# Patient Record
Sex: Female | Born: 1975 | Hispanic: Yes | Marital: Married | Smoking: Never smoker
Health system: Southern US, Community
[De-identification: ages and names within clinical notes are randomized; demographics above are authoritative.]

## PROBLEM LIST (undated history)

## (undated) DIAGNOSIS — F419 Anxiety disorder, unspecified: Secondary | ICD-10-CM

## (undated) HISTORY — PX: KNEE ARTHROSCOPY: SHX127

---

## 2012-03-01 ENCOUNTER — Other Ambulatory Visit (HOSPITAL_COMMUNITY)
Admission: RE | Admit: 2012-03-01 | Discharge: 2012-03-01 | Disposition: A | Discharge status: Home / Self Care | Payer: Managed Care, Other (non HMO) | Source: Ambulatory Visit | Attending: Family Medicine | Admitting: Family Medicine

## 2012-03-01 DIAGNOSIS — Z01419 Encounter for gynecological examination (general) (routine) without abnormal findings: Secondary | ICD-10-CM | POA: Insufficient documentation

## 2012-03-02 ENCOUNTER — Other Ambulatory Visit: Payer: Self-pay | Admitting: Family Medicine

## 2012-03-02 DIAGNOSIS — R229 Localized swelling, mass and lump, unspecified: Secondary | ICD-10-CM

## 2013-03-03 ENCOUNTER — Other Ambulatory Visit (HOSPITAL_COMMUNITY)
Admission: RE | Admit: 2013-03-03 | Discharge: 2013-03-03 | Disposition: A | Discharge status: Home / Self Care | Payer: Managed Care, Other (non HMO) | Source: Ambulatory Visit | Attending: Family Medicine | Admitting: Family Medicine

## 2013-03-03 ENCOUNTER — Other Ambulatory Visit: Payer: Self-pay | Admitting: Family Medicine

## 2013-03-03 DIAGNOSIS — Z01419 Encounter for gynecological examination (general) (routine) without abnormal findings: Secondary | ICD-10-CM | POA: Insufficient documentation

## 2015-08-22 ENCOUNTER — Other Ambulatory Visit: Payer: Self-pay | Admitting: Family Medicine

## 2015-08-22 ENCOUNTER — Other Ambulatory Visit (HOSPITAL_COMMUNITY)
Admission: RE | Admit: 2015-08-22 | Discharge: 2015-08-22 | Disposition: A | Discharge status: Home / Self Care | Payer: BLUE CROSS/BLUE SHIELD | Source: Ambulatory Visit | Attending: Family Medicine | Admitting: Family Medicine

## 2015-08-22 DIAGNOSIS — Z01419 Encounter for gynecological examination (general) (routine) without abnormal findings: Secondary | ICD-10-CM | POA: Diagnosis present

## 2015-08-26 LAB — CYTOLOGY - PAP

## 2017-10-06 ENCOUNTER — Other Ambulatory Visit: Payer: Self-pay | Admitting: Nurse Practitioner

## 2017-10-06 DIAGNOSIS — Z139 Encounter for screening, unspecified: Secondary | ICD-10-CM

## 2017-11-03 ENCOUNTER — Ambulatory Visit
Admission: RE | Admit: 2017-11-03 | Discharge: 2017-11-03 | Disposition: A | Discharge status: Home / Self Care | Payer: BLUE CROSS/BLUE SHIELD | Source: Ambulatory Visit | Attending: Nurse Practitioner | Admitting: Nurse Practitioner

## 2017-11-03 DIAGNOSIS — Z139 Encounter for screening, unspecified: Secondary | ICD-10-CM

## 2017-11-04 ENCOUNTER — Other Ambulatory Visit: Payer: Self-pay | Admitting: Nurse Practitioner

## 2017-11-04 DIAGNOSIS — R928 Other abnormal and inconclusive findings on diagnostic imaging of breast: Secondary | ICD-10-CM

## 2017-11-10 ENCOUNTER — Ambulatory Visit: Payer: BLUE CROSS/BLUE SHIELD

## 2017-11-10 ENCOUNTER — Ambulatory Visit
Admission: RE | Admit: 2017-11-10 | Discharge: 2017-11-10 | Disposition: A | Discharge status: Home / Self Care | Payer: BLUE CROSS/BLUE SHIELD | Source: Ambulatory Visit | Attending: Nurse Practitioner | Admitting: Nurse Practitioner

## 2017-11-10 ENCOUNTER — Other Ambulatory Visit: Payer: Self-pay | Admitting: Nurse Practitioner

## 2017-11-10 DIAGNOSIS — R928 Other abnormal and inconclusive findings on diagnostic imaging of breast: Secondary | ICD-10-CM

## 2017-11-10 DIAGNOSIS — N6320 Unspecified lump in the left breast, unspecified quadrant: Secondary | ICD-10-CM

## 2017-11-18 DIAGNOSIS — Z9889 Other specified postprocedural states: Secondary | ICD-10-CM | POA: Insufficient documentation

## 2018-05-16 ENCOUNTER — Other Ambulatory Visit: Payer: Self-pay | Admitting: Nurse Practitioner

## 2018-05-16 ENCOUNTER — Ambulatory Visit
Admission: RE | Admit: 2018-05-16 | Discharge: 2018-05-16 | Disposition: A | Discharge status: Home / Self Care | Payer: BLUE CROSS/BLUE SHIELD | Source: Ambulatory Visit | Attending: Nurse Practitioner | Admitting: Nurse Practitioner

## 2018-05-16 DIAGNOSIS — N6320 Unspecified lump in the left breast, unspecified quadrant: Secondary | ICD-10-CM

## 2018-11-17 ENCOUNTER — Ambulatory Visit
Admission: RE | Admit: 2018-11-17 | Discharge: 2018-11-17 | Disposition: A | Discharge status: Home / Self Care | Payer: BLUE CROSS/BLUE SHIELD | Source: Ambulatory Visit | Attending: Nurse Practitioner | Admitting: Nurse Practitioner

## 2018-11-17 DIAGNOSIS — N6320 Unspecified lump in the left breast, unspecified quadrant: Secondary | ICD-10-CM

## 2019-07-04 ENCOUNTER — Other Ambulatory Visit: Payer: Self-pay | Admitting: Nurse Practitioner

## 2019-07-04 DIAGNOSIS — N6320 Unspecified lump in the left breast, unspecified quadrant: Secondary | ICD-10-CM

## 2019-11-20 ENCOUNTER — Ambulatory Visit
Admission: RE | Admit: 2019-11-20 | Discharge: 2019-11-20 | Disposition: A | Discharge status: Home / Self Care | Payer: BLUE CROSS/BLUE SHIELD | Source: Ambulatory Visit | Attending: Nurse Practitioner | Admitting: Nurse Practitioner

## 2019-11-20 ENCOUNTER — Other Ambulatory Visit: Payer: Self-pay

## 2019-11-20 DIAGNOSIS — N6320 Unspecified lump in the left breast, unspecified quadrant: Secondary | ICD-10-CM

## 2020-02-10 ENCOUNTER — Ambulatory Visit: Payer: Self-pay | Attending: Internal Medicine

## 2020-02-10 DIAGNOSIS — Z23 Encounter for immunization: Secondary | ICD-10-CM | POA: Insufficient documentation

## 2020-02-10 NOTE — Progress Notes (Signed)
   Covid-19 Vaccination Clinic  Name:  Valerie Steele    MRN: [id number] DOB: 04/10/1976  02/10/2020  [name] was observed post Covid-19 immunization for 15 minutes without incident. She was provided with Vaccine Information Sheet and instruction to access the V-Safe system.   [name] was instructed to call 911 with any severe reactions post vaccine: Marland Kitchen Difficulty breathing  . Swelling of face and throat  . A fast heartbeat  . A bad rash all over body  . Dizziness and weakness   Immunizations Administered    Name Date Dose VIS Date Route   Pfizer COVID-19 Vaccine 02/10/2020 12:43 PM 0.3 mL 11/17/2019 Intramuscular   Manufacturer: [place]   Lot: [id number]   [place]: [id number]

## 2020-03-02 ENCOUNTER — Ambulatory Visit: Payer: Self-pay | Attending: Internal Medicine

## 2020-03-02 DIAGNOSIS — Z23 Encounter for immunization: Secondary | ICD-10-CM

## 2020-03-02 NOTE — Progress Notes (Signed)
   Covid-19 Vaccination Clinic  Name:  Valerie Steele    MRN: [id number] DOB: 17-Mar-1976  03/02/2020  [name] was observed post Covid-19 immunization for 15 minutes without incident. She was provided with Vaccine Information Sheet and instruction to access the V-Safe system.   [name] was instructed to call 911 with any severe reactions post vaccine: Marland Kitchen Difficulty breathing  . Swelling of face and throat  . A fast heartbeat  . A bad rash all over body  . Dizziness and weakness   Immunizations Administered    Name Date Dose VIS Date Route   Pfizer COVID-19 Vaccine 03/02/2020 12:10 PM 0.3 mL 11/17/2019 Intramuscular   Manufacturer: [place]   Lot: [id number]   [place]: [id number]

## 2020-08-29 ENCOUNTER — Encounter (HOSPITAL_BASED_OUTPATIENT_CLINIC_OR_DEPARTMENT_OTHER): Payer: Self-pay | Admitting: Obstetrics and Gynecology

## 2020-08-29 ENCOUNTER — Other Ambulatory Visit: Payer: Self-pay

## 2020-08-31 ENCOUNTER — Inpatient Hospital Stay (HOSPITAL_COMMUNITY): Admission: RE | Admit: 2020-08-31 | Payer: 59 | Source: Ambulatory Visit

## 2020-09-02 ENCOUNTER — Other Ambulatory Visit (HOSPITAL_COMMUNITY)
Admission: RE | Admit: 2020-09-02 | Discharge: 2020-09-02 | Disposition: A | Discharge status: Home / Self Care | Payer: 59 | Source: Ambulatory Visit | Attending: Obstetrics and Gynecology | Admitting: Obstetrics and Gynecology

## 2020-09-02 DIAGNOSIS — Z01812 Encounter for preprocedural laboratory examination: Secondary | ICD-10-CM | POA: Diagnosis present

## 2020-09-02 DIAGNOSIS — Z20822 Contact with and (suspected) exposure to covid-19: Secondary | ICD-10-CM | POA: Insufficient documentation

## 2020-09-02 LAB — SARS CORONAVIRUS 2 (TAT 6-24 HRS): SARS Coronavirus 2: NEGATIVE

## 2020-09-03 ENCOUNTER — Other Ambulatory Visit: Payer: Self-pay | Admitting: Obstetrics and Gynecology

## 2020-09-03 NOTE — H&P (Deleted)
  The note originally documented on this encounter has been moved the the encounter in which it belongs.  

## 2020-09-03 NOTE — H&P (Signed)
--------------------------------------------------------------------------------  Subjective:    Chief Complaint(s):      Pre op/ Heavy Menses       HPI:          General          44 yo presents for pre-op visit.            Pt is scheduled for hysteroscopy/D&C w/ hydrothermal ablation on Sept 29, 2021 @ 12 PM for management of menorrhagia.            Pt seen Sept 3, 2020 c/o menorrhagia with her regular cycle. She was not interested in maintaining fertility. She contacted office on Apr 29, 2020 expressing interest in NovaSure ablation or HTA for management.            U/S performed on May 13, 2020 revealed uterus measuring 8.9 x 3.9 x 5.5 cm. Endometrium measured 4.8 mm. 4 fibroids were measured w/ the largest measuring 2.2 cm. RT OV WNL. LT OV contained 2 simple cysts, measuring 2.3 cm and 1.7 cm.            Pelvic exam normal.     Current Medication:      Taking   Valtrex(valACYclovir HCl) 500 MG Tablet 1 tablet Orally every 24 hrs, prn, Notes: prn.      Wellbutrin XL(buPROPion HCl ER (XL)) 300 MG Tablet Extended Release 24 Hour 1 tablet in the morning Orally Once a day.      Allergy(diphenhydrAMINE HCl) 4 MG Tablet 1 tablet as needed Orally every 6 hrs.         Discontinued   MVI.      Mirena 20 MCG/24HR Intrauterine Device as directed Intrauterine.      Vitamin B Complex Capsule as directed Orally.      Vitamin D 2000 UNIT Tablet 1 tablet with a meal Orally Once a day.      buPROPion HCl ER (XL) 150MG  Tablet Extended Release 24 Hour take 1 tablet in the morning once daily Orally Once a day.      Medication List reviewed and reconciled with the patient.      Medical History:   migraines      GDM      postpartum depression       Allergies/Intolerance:      Percocet: Allergy - vomiting    Gyn History:   Sexual activity currently sexually active.   Periods : irregular, every 2 weeks.   LMP 08/24/2020.   Denies Birth control.   Last pap smear date 2018, per pt, at  [facility].   Last mammogram date 05/2019 - per pt .   Abnormal pap smear abnormal in 2004, assessed with colposcopy; treated with conization.   STD Herpes.        OB History:   Number of pregnancies  3.   Pregnancy # 1  06/2006, C-section delivery, boy, 7.4 at 38 wks.   Pregnancy # 2  12/2008, abortion.   Pregnancy # 3  11/2009, live birth, C-section,6.13 at 24 wks, girl.        Surgical History:   c-section x 2 2007, 2010      bilateral foot surgery including bunion surgery 05/1996      left foot 01/2015      left knee surgery 2018       Hospitalization:   c-section x 2 2007 & 2010       Family History:   Father: deceased 16 yrs, Hypertension, CAD, Diabetes, diagnosed with  Diabetes, Hypertension, CVA, Coronary artery disease    Mother: alive, HTN, -insulin diabetes, breast cancer, depression, diagnosed with Breast cancer, Diabetes, Hypertension    Paternal [place] Father: deceased, heart attack, diabetes    Paternal [place] Mother: deceased, diabetes    Maternal Grand Father: deceased, killed in war    Maternal [place] Mother: alive, insulin dibetes, HTN    Sister 1: alive, insulin disbetes    1 sister(s) . 1 son(s) , 1 daughter(s) .          1/2 sister-heart disease.     Social History:       General         Tobacco use cigarettes:  Never smoked, Tobacco history last updated  08/27/2020, Vaping  No.           no EXPOSURE TO PASSIVE SMOKE.           Alcohol: yes, occasionally.           Caffeine: yes, coffee, 1 serving daily.           no Recreational drug use.           DIET: regular.           Exercise: regularly.           DENTAL CARE: every 6 mos.           Marital Status: married, husbands name is Hydrographic surveyor..           Children: 1, daughter (s), 1, son (s).           OCCUPATION: House wife.      ROS:       CONSTITUTIONAL         Chills  No.  Fatigue  No.  Fever  No.  Night sweats  No.  Recent travel outside [place]  No.  Sweats  No.  Weight change  No.         OPHTHALMOLOGY          Blurring of vision  no.  Change in vision  no.  Double vision  no.         ENT         Dizziness  no.  Nose bleeds  no.  Sore throat  no.  Teeth pain  no.         ALLERGY         Hives  no.         CARDIOLOGY         Chest pain  no.  High blood pressure  no.  Irregular heart beat  no.  Leg edema  no.  Palpitations  no.         RESPIRATORY         Shortness of breath  no.  Cough  no.  Wheezing  no.         UROLOGY         Pain with urination  no.  Urinary urgency  no.  Urinary frequency  no.  Urinary incontinence  no.  Difficulty urinating  No.  Blood in urine  No.         GASTROENTEROLOGY         Abdominal pain  no.  Appetite change  no.  Bloating/belching  no.  Blood in stool or on toilet paper  no.  Change in bowel movements  no.  Constipation  no.  Diarrhea  no.  Difficulty swallowing  no.  Nausea  no.  FEMALE REPRODUCTIVE         Vulvar pain  no.  Vulvar rash  no.  Abnormal vaginal bleeding  no.  Breast pain  no.  Nipple discharge  no.  Pain with intercourse  no.  Pelvic pain  no.  Unusual vaginal discharge  no.  Vaginal itching  no.         MUSCULOSKELETAL         Muscle aches  no.         NEUROLOGY         Headache  YES.  Tingling/numbness  no.  Weakness  no.         PSYCHOLOGY         Depression  no.  Anxiety  YES.  Nervousness  no.  Sleep disturbances  no.  Suicidal ideation  no .         ENDOCRINOLOGY         Excessive thirst  no.  Excessive urination  no.  Hair loss  no.  Heat or cold intolerance  no.         HEMATOLOGY/LYMPH         Abnormal bleeding  no.  Easy bruising  no.  Swollen glands  no.         DERMATOLOGY         New/changing skin lesion  no.  Rash  no.  Sores  no.            Negative except as stated in HPI.   Objective:    Vitals:        Wt 112.4, Wt change 4.4 lb, Ht 59.25, BMI 22.51, Pulse sitting 66, BP sitting 106/58.     Past Results:    Examination:          General Examination         CONSTITUTIONAL: alert, oriented, NAD .           SKIN: moist, warm.          EYES: Conjunctiva clear.          LUNGS: good I:E efffort noted, CTA bilat.          HEART: RRR.          ABDOMEN: soft, non-tender/non-distended, bowel sounds present .          FEMALE GENITOURINARY: normal external genitalia, labia - unremarkable, vagina - pink moist mucosa, no lesions or abnormal discharge, cervix - no discharge or lesions or CMT, adnexa - no masses or tenderness, uterus - nontender and normal size on palpation .          PSYCH: affect normal, good eye contact.      Physical Examination:    Assessment:     Assessment:    Menorrhagia with regular cycle - N92.0 (Primary)      Uterine fibroid - D25.9        Plan:    Treatment:      Menorrhagia with regular cycle          Notes: Pt is scheduled for hysteroscopy/D&C w/ hydrothermal ablation on Sept 29, 2021 @ 12 PM for management of menorrhagia and fibroids. Pt advised that 93% of women have decreased or no period for up to 5 yrs. She is advised that this will manage menorrhagia, but not fibroids. Discussed w/ pt risks of hysteroscopy including but not limited to infection, bleeding, possible perforation of uterus, with the need for further surgery. Pt advised to avoid NSAIDs (  Asprin, Aleve, Advil, Ibuprofen, Motrin) from now until surgery given risk of bleeding during surgery. She is advised to avoid eating or drinking starting midnight prior to surgery. Pt is advised that she may have watery discharge for 2 wks after surgery. Discussed post-surgery avoidance of driving for 24 hrs and avoidance of intercourse for 2 weeks after procedure. Follow up 2 wks after surgery for post-op visit.      Uterine fibroid          Notes: Pt is scheduled for hysteroscopy/D&C w/ hydrothermal ablation on Sept 29, 2021 @ 12 PM for management of menorrhagia. Pt advised HTA will not affect her fibroids. Follow up for 2 wk post-op visit.

## 2020-09-04 ENCOUNTER — Ambulatory Visit (HOSPITAL_BASED_OUTPATIENT_CLINIC_OR_DEPARTMENT_OTHER): Payer: 59 | Admitting: Certified Registered"

## 2020-09-04 ENCOUNTER — Ambulatory Visit (HOSPITAL_BASED_OUTPATIENT_CLINIC_OR_DEPARTMENT_OTHER)
Admission: RE | Admit: 2020-09-04 | Discharge: 2020-09-04 | Disposition: A | Discharge status: Home / Self Care | Payer: 59 | Attending: Obstetrics and Gynecology | Admitting: Obstetrics and Gynecology

## 2020-09-04 ENCOUNTER — Encounter (HOSPITAL_BASED_OUTPATIENT_CLINIC_OR_DEPARTMENT_OTHER): Payer: Self-pay | Admitting: Obstetrics and Gynecology

## 2020-09-04 ENCOUNTER — Other Ambulatory Visit: Payer: Self-pay

## 2020-09-04 ENCOUNTER — Encounter (HOSPITAL_BASED_OUTPATIENT_CLINIC_OR_DEPARTMENT_OTHER)
Admission: RE | Disposition: A | Discharge status: Home / Self Care | Payer: Self-pay | Source: Home / Self Care | Attending: Obstetrics and Gynecology

## 2020-09-04 DIAGNOSIS — Z79899 Other long term (current) drug therapy: Secondary | ICD-10-CM | POA: Insufficient documentation

## 2020-09-04 DIAGNOSIS — A609 Anogenital herpesviral infection, unspecified: Secondary | ICD-10-CM | POA: Insufficient documentation

## 2020-09-04 DIAGNOSIS — N840 Polyp of corpus uteri: Secondary | ICD-10-CM | POA: Diagnosis not present

## 2020-09-04 DIAGNOSIS — N841 Polyp of cervix uteri: Secondary | ICD-10-CM | POA: Diagnosis not present

## 2020-09-04 DIAGNOSIS — N920 Excessive and frequent menstruation with regular cycle: Secondary | ICD-10-CM | POA: Insufficient documentation

## 2020-09-04 DIAGNOSIS — Z7722 Contact with and (suspected) exposure to environmental tobacco smoke (acute) (chronic): Secondary | ICD-10-CM | POA: Insufficient documentation

## 2020-09-04 DIAGNOSIS — D259 Leiomyoma of uterus, unspecified: Secondary | ICD-10-CM | POA: Diagnosis present

## 2020-09-04 HISTORY — PX: DILITATION & CURRETTAGE/HYSTROSCOPY WITH HYDROTHERMAL ABLATION: SHX5570

## 2020-09-04 HISTORY — DX: Anxiety disorder, unspecified: F41.9

## 2020-09-04 LAB — POCT PREGNANCY, URINE: Preg Test, Ur: NEGATIVE

## 2020-09-04 SURGERY — DILATATION & CURETTAGE/HYSTEROSCOPY WITH HYDROTHERMAL ABLATION
Anesthesia: General | Site: Vagina

## 2020-09-04 MED ORDER — FENTANYL CITRATE (PF) 100 MCG/2ML IJ SOLN
INTRAMUSCULAR | Status: AC
  Filled 2020-09-04: qty 2

## 2020-09-04 MED ORDER — TRAMADOL HCL 50 MG PO TABS
ORAL_TABLET | ORAL | Status: AC
  Filled 2020-09-04: qty 1

## 2020-09-04 MED ORDER — FENTANYL CITRATE (PF) 100 MCG/2ML IJ SOLN
INTRAMUSCULAR | Status: DC | PRN
Start: 2020-09-04 — End: 2020-09-04
  Administered 2020-09-04: 25 ug via INTRAVENOUS
  Administered 2020-09-04: 50 ug via INTRAVENOUS
  Administered 2020-09-04: 25 ug via INTRAVENOUS

## 2020-09-04 MED ORDER — ONDANSETRON HCL 4 MG/2ML IJ SOLN
INTRAMUSCULAR | Status: AC
  Filled 2020-09-04: qty 2

## 2020-09-04 MED ORDER — PROPOFOL 500 MG/50ML IV EMUL
INTRAVENOUS | Status: DC | PRN
  Administered 2020-09-04: 25 ug/kg/min via INTRAVENOUS

## 2020-09-04 MED ORDER — OXYCODONE HCL 5 MG/5ML PO SOLN: 5.0000 mg | Freq: Once | ORAL | Status: DC | PRN

## 2020-09-04 MED ORDER — MIDAZOLAM HCL 2 MG/2ML IJ SOLN
INTRAMUSCULAR | Status: AC
  Filled 2020-09-04: qty 2

## 2020-09-04 MED ORDER — KETOROLAC TROMETHAMINE 30 MG/ML IJ SOLN
INTRAMUSCULAR | Status: AC
  Filled 2020-09-04: qty 1

## 2020-09-04 MED ORDER — BUPIVACAINE HCL (PF) 0.25 % IJ SOLN
INTRAMUSCULAR | Status: DC | PRN
  Administered 2020-09-04: 20 mL

## 2020-09-04 MED ORDER — MIDAZOLAM HCL 5 MG/5ML IJ SOLN
INTRAMUSCULAR | Status: DC | PRN
  Administered 2020-09-04: 2 mg via INTRAVENOUS

## 2020-09-04 MED ORDER — ACETAMINOPHEN 160 MG/5ML PO SOLN: 325.0000 mg | ORAL | Status: DC | PRN

## 2020-09-04 MED ORDER — SODIUM CHLORIDE 0.9 % IR SOLN
Status: DC | PRN
  Administered 2020-09-04: 3000 mL

## 2020-09-04 MED ORDER — LACTATED RINGERS IV SOLN: INTRAVENOUS | Status: DC

## 2020-09-04 MED ORDER — DEXAMETHASONE SODIUM PHOSPHATE 10 MG/ML IJ SOLN
INTRAMUSCULAR | Status: DC | PRN
  Administered 2020-09-04: 5 mg via INTRAVENOUS

## 2020-09-04 MED ORDER — ACETAMINOPHEN 500 MG PO TABS
ORAL_TABLET | ORAL | Status: AC
  Filled 2020-09-04: qty 2

## 2020-09-04 MED ORDER — KETOROLAC TROMETHAMINE 30 MG/ML IJ SOLN
30.0000 mg | Freq: Once | INTRAMUSCULAR | Status: AC
  Administered 2020-09-04: 30 mg via INTRAVENOUS

## 2020-09-04 MED ORDER — ONDANSETRON HCL 4 MG/2ML IJ SOLN
4.0000 mg | Freq: Once | INTRAMUSCULAR | Status: AC | PRN
  Administered 2020-09-04: 4 mg via INTRAVENOUS

## 2020-09-04 MED ORDER — SILVER NITRATE-POT NITRATE 75-25 % EX MISC
CUTANEOUS | Status: AC
  Filled 2020-09-04: qty 10

## 2020-09-04 MED ORDER — SILVER NITRATE-POT NITRATE 75-25 % EX MISC
CUTANEOUS | Status: DC | PRN
  Administered 2020-09-04: 2

## 2020-09-04 MED ORDER — LIDOCAINE HCL (CARDIAC) PF 100 MG/5ML IV SOSY
PREFILLED_SYRINGE | INTRAVENOUS | Status: DC | PRN
  Administered 2020-09-04: 50 mg via INTRATRACHEAL

## 2020-09-04 MED ORDER — OXYCODONE HCL 5 MG PO TABS: 5.0000 mg | ORAL_TABLET | Freq: Once | ORAL | Status: DC | PRN

## 2020-09-04 MED ORDER — FENTANYL CITRATE (PF) 100 MCG/2ML IJ SOLN
25.0000 ug | INTRAMUSCULAR | Status: DC | PRN
  Administered 2020-09-04: 50 ug via INTRAVENOUS
  Administered 2020-09-04: 25 ug via INTRAVENOUS
  Administered 2020-09-04: 50 ug via INTRAVENOUS
  Administered 2020-09-04: 25 ug via INTRAVENOUS

## 2020-09-04 MED ORDER — MEPERIDINE HCL 25 MG/ML IJ SOLN: 6.2500 mg | INTRAMUSCULAR | Status: DC | PRN

## 2020-09-04 MED ORDER — IBUPROFEN 800 MG PO TABS: 800.0000 mg | ORAL_TABLET | Freq: Three times a day (TID) | ORAL | 0 refills | Status: AC | PRN

## 2020-09-04 MED ORDER — ACETAMINOPHEN 325 MG PO TABS: 325.0000 mg | ORAL_TABLET | ORAL | Status: DC | PRN

## 2020-09-04 MED ORDER — OXYCODONE HCL 5 MG PO TABS
ORAL_TABLET | ORAL | Status: AC
  Filled 2020-09-04: qty 1

## 2020-09-04 MED ORDER — TRAMADOL HCL 50 MG PO TABS
50.0000 mg | ORAL_TABLET | Freq: Four times a day (QID) | ORAL | Status: AC | PRN
  Administered 2020-09-04: 50 mg via ORAL

## 2020-09-04 MED ORDER — POVIDONE-IODINE 10 % EX SWAB: 2.0000 "application " | Freq: Once | CUTANEOUS | Status: DC

## 2020-09-04 MED ORDER — ONDANSETRON HCL 4 MG/2ML IJ SOLN
INTRAMUSCULAR | Status: DC | PRN
  Administered 2020-09-04: 4 mg via INTRAVENOUS

## 2020-09-04 MED ORDER — BUPIVACAINE HCL (PF) 0.25 % IJ SOLN
INTRAMUSCULAR | Status: AC
  Filled 2020-09-04: qty 30

## 2020-09-04 MED ORDER — ACETAMINOPHEN 500 MG PO TABS
1000.0000 mg | ORAL_TABLET | ORAL | Status: AC
  Administered 2020-09-04: 1000 mg via ORAL

## 2020-09-04 MED ORDER — PROPOFOL 10 MG/ML IV BOLUS
INTRAVENOUS | Status: DC | PRN
  Administered 2020-09-04: 120 mg via INTRAVENOUS

## 2020-09-04 SURGICAL SUPPLY — 15 items
CATH ROBINSON RED A/P 16FR (CATHETERS) ×6 IMPLANT
ELECT REM PT RETURN 9FT ADLT (ELECTROSURGICAL)
ELECTRODE REM PT RTRN 9FT ADLT (ELECTROSURGICAL) IMPLANT
GLOVE BIOGEL M 6.5 STRL (GLOVE) ×6 IMPLANT
GLOVE BIOGEL PI IND STRL 6.5 (GLOVE) ×1 IMPLANT
GLOVE BIOGEL PI IND STRL 7.0 (GLOVE) ×1 IMPLANT
GLOVE BIOGEL PI INDICATOR 6.5 (GLOVE) ×2
GLOVE BIOGEL PI INDICATOR 7.0 (GLOVE) ×2
GOWN STRL REUS W/ TWL LRG LVL3 (GOWN DISPOSABLE) ×2 IMPLANT
GOWN STRL REUS W/TWL LRG LVL3 (GOWN DISPOSABLE) ×6
PACK VAGINAL MINOR WOMEN LF (CUSTOM PROCEDURE TRAY) ×3 IMPLANT
PAD OB MATERNITY 4.3X12.25 (PERSONAL CARE ITEMS) ×3 IMPLANT
SET GENESYS HTA PROCERVA (MISCELLANEOUS) ×3 IMPLANT
TOWEL GREEN STERILE FF (TOWEL DISPOSABLE) ×6 IMPLANT
UNDERPAD 30X36 HEAVY ABSORB (UNDERPADS AND DIAPERS) ×3 IMPLANT

## 2020-09-04 NOTE — Discharge Instructions (Signed)
  Post Anesthesia Home Care Instructions  Activity: Get plenty of rest for the remainder of the day. A responsible individual must stay with you for 24 hours following the procedure.  For the next 24 hours, DO NOT: -Drive a car -Paediatric nurse -Drink alcoholic beverages -Take any medication unless instructed by your physician -Make any legal decisions or sign important papers.  Meals: Start with liquid foods such as gelatin or soup. Progress to regular foods as tolerated. Avoid greasy, spicy, heavy foods. If nausea and/or vomiting occur, drink only clear liquids until the nausea and/or vomiting subsides. Call your physician if vomiting continues.  Special Instructions/Symptoms: Your throat may feel dry or sore from the anesthesia or the breathing tube placed in your throat during surgery. If this causes discomfort, gargle with warm salt water. The discomfort should disappear within 24 hours.  May take Tylenol after 5pm, if needed.  May take Ibuprofen after 8pm, if needed.

## 2020-09-04 NOTE — Op Note (Signed)
09/04/2020  1:39 PM  PATIENT:  Valerie Steele  44 y.o. female  PRE-OPERATIVE DIAGNOSIS:  D25.9-Uterine fibroid N92.0-Menorrhagia with regular cycle  POST-OPERATIVE DIAGNOSIS:  D25.9-Uterine fibroid N92.0-Menorrhagia with regular cycle  PROCEDURE:  Procedure(s): DILATATION & CURETTAGE/HYSTEROSCOPY WITH HYDROTHERMAL ABLATION (N/A)  SURGEON:  Surgeon(s) and Role:    Christophe Louis, MD - Primary  PHYSICIAN ASSISTANT:   ASSISTANTS: none   ANESTHESIA:   General   EBL:  10 mL   BLOOD ADMINISTERED:none  DRAINS: none   LOCAL MEDICATIONS USED:  MARCAINE     SPECIMEN:  Source of Specimen:  endometrial currettings and endocervcal polyp   DISPOSITION OF SPECIMEN:  PATHOLOGY  COUNTS:  YES  TOURNIQUET:  * No tourniquets in log *  DICTATION: .Dragon Dictation  PLAN OF CARE: Discharge to home after PACU  PATIENT DISPOSITION:  PACU - hemodynamically stable.   Delay start of Pharmacological VTE agent (>24hrs) due to surgical blood loss or risk of bleeding: not applicable  Findings: normal external genitalia.Marland Kitchen endocervical polyp .. proliferative endometrium   Procedure. The patient was taken to the operative room were a time out was performed. She was placed in the dorsal lithotomy position and prepped and draped in the normal sterile fashion. A speculum was placed in the vaginal vault. The anterior lip of the cervix was grasped with a single tooth tenaculum. The uterus was sounded to 8 cm. The cervix was dilated to 8 mm. The hydrothermal hysteroscope was inserted. The ostia were visualized bilaterally. There was no evidence of perforation. The hydrothermal ablation was performed for 10 minutes.  The hysteroscope was removed. An endometrial curette was used to perform curettage. 10 cc of 25% marcaine was injected at the 4 and 8 oclock position. A The single tooth tenaculum was removed.  Excellent hemostasis was noted.   Sponge lap and needle counts were correct x 2.  The patient was  awakened from anesthesia and taken to the recovery room in stable condition.

## 2020-09-04 NOTE — Anesthesia Preprocedure Evaluation (Addendum)
Anesthesia Evaluation  Patient identified by MRN, date of birth, ID band Patient awake    Reviewed: Allergy & Precautions, H&P , NPO status , Patient's Chart, lab work & pertinent test results, reviewed documented beta blocker date and time   Airway Mallampati: I  TM Distance: >3 FB Neck ROM: full    Dental no notable dental hx. (+) Teeth Intact, Dental Advisory Given   Pulmonary neg pulmonary ROS,    Pulmonary exam normal breath sounds clear to auscultation       Cardiovascular Exercise Tolerance: Good negative cardio ROS   Rhythm:regular Rate:Normal     Neuro/Psych PSYCHIATRIC DISORDERS Anxiety negative neurological ROS     GI/Hepatic negative GI ROS, Neg liver ROS,   Endo/Other  negative endocrine ROS  Renal/GU negative Renal ROS  negative genitourinary   Musculoskeletal   Abdominal   Peds  Hematology negative hematology ROS (+)   Anesthesia Other Findings   Reproductive/Obstetrics negative OB ROS                            Anesthesia Physical Anesthesia Plan  ASA: II  Anesthesia Plan: General   Post-op Pain Management:    Induction: Intravenous  PONV Risk Score and Plan:   Airway Management Planned: LMA  Additional Equipment:   Intra-op Plan:   Post-operative Plan:   Informed Consent: I have reviewed the patients History and Physical, chart, labs and discussed the procedure including the risks, benefits and alternatives for the proposed anesthesia with the patient or authorized representative who has indicated his/her understanding and acceptance.     Dental Advisory Given  Plan Discussed with: CRNA and Anesthesiologist  Anesthesia Plan Comments: ( )        Anesthesia Quick Evaluation

## 2020-09-04 NOTE — Anesthesia Procedure Notes (Signed)
Procedure Name: LMA Insertion Date/Time: 09/04/2020 12:31 PM Performed by: Glory Buff, CRNA Pre-anesthesia Checklist: Patient identified, Emergency Drugs available, Suction available and Patient being monitored Patient Re-evaluated:Patient Re-evaluated prior to induction Oxygen Delivery Method: Circle system utilized Preoxygenation: Pre-oxygenation with 100% oxygen Induction Type: IV induction LMA: LMA inserted LMA Size: 4.0 Placement Confirmation: positive ETCO2 Tube secured with: Tape Dental Injury: Teeth and Oropharynx as per pre-operative assessment

## 2020-09-04 NOTE — H&P (Signed)
Date of Initial H&P: 09/03/2020  History reviewed, patient examined, no change in status, stable for surgery.

## 2020-09-04 NOTE — Transfer of Care (Signed)
Immediate Anesthesia Transfer of Care Note  Patient: Valerie Steele  Procedure(s) Performed: DILATATION & CURETTAGE/HYSTEROSCOPY WITH HYDROTHERMAL ABLATION (N/A Vagina )  Patient Location: PACU  Anesthesia Type:General  Level of Consciousness: drowsy, patient cooperative and responds to stimulation  Airway & Oxygen Therapy: Patient Spontanous Breathing and Patient connected to face mask oxygen  Post-op Assessment: Report given to RN and Post -op Vital signs reviewed and stable  Post vital signs: Reviewed and stable  Last Vitals:  Vitals Value Taken Time  BP    Temp    Pulse 88 09/04/20 1326  Resp 12 09/04/20 1326  SpO2 100 % 09/04/20 1326  Vitals shown include unvalidated device data.  Last Pain:  Vitals:   09/04/20 1101  TempSrc: Oral  PainSc: 0-No pain         Complications: No complications documented.

## 2020-09-04 NOTE — Anesthesia Postprocedure Evaluation (Signed)
Anesthesia Post Note  Patient: Valerie Steele  Procedure(s) Performed: DILATATION & CURETTAGE/HYSTEROSCOPY WITH HYDROTHERMAL ABLATION (N/A Vagina )     Patient location during evaluation: PACU Anesthesia Type: General Level of consciousness: awake and alert Pain management: pain level controlled Vital Signs Assessment: post-procedure vital signs reviewed and stable Respiratory status: spontaneous breathing, nonlabored ventilation, respiratory function stable and patient connected to nasal cannula oxygen Cardiovascular status: blood pressure returned to baseline and stable Postop Assessment: no apparent nausea or vomiting Anesthetic complications: no   No complications documented.  Last Vitals:  Vitals:   09/04/20 1400 09/04/20 1415  BP: 123/76 129/75  Pulse: 85 76  Resp: 20 19  Temp:    SpO2: 99% 95%    Last Pain:  Vitals:   09/04/20 1430  TempSrc:   PainSc: 6                  Laquandra Carrillo

## 2020-09-05 ENCOUNTER — Encounter (HOSPITAL_BASED_OUTPATIENT_CLINIC_OR_DEPARTMENT_OTHER): Payer: Self-pay | Admitting: Obstetrics and Gynecology

## 2020-09-05 LAB — SURGICAL PATHOLOGY

## 2020-10-10 ENCOUNTER — Other Ambulatory Visit: Payer: Self-pay | Admitting: Nurse Practitioner

## 2020-10-10 DIAGNOSIS — Z1231 Encounter for screening mammogram for malignant neoplasm of breast: Secondary | ICD-10-CM

## 2020-12-02 ENCOUNTER — Ambulatory Visit: Payer: 59

## 2020-12-13 ENCOUNTER — Ambulatory Visit
Admission: RE | Admit: 2020-12-13 | Discharge: 2020-12-13 | Disposition: A | Discharge status: Home / Self Care | Payer: 59 | Source: Ambulatory Visit | Attending: Nurse Practitioner | Admitting: Nurse Practitioner

## 2020-12-13 ENCOUNTER — Other Ambulatory Visit: Payer: Self-pay

## 2020-12-13 DIAGNOSIS — Z1231 Encounter for screening mammogram for malignant neoplasm of breast: Secondary | ICD-10-CM

## 2021-03-10 ENCOUNTER — Ambulatory Visit: Payer: Self-pay

## 2021-03-10 ENCOUNTER — Ambulatory Visit (INDEPENDENT_AMBULATORY_CARE_PROVIDER_SITE_OTHER): Payer: 59 | Admitting: Orthopaedic Surgery

## 2021-03-10 ENCOUNTER — Other Ambulatory Visit: Payer: Self-pay

## 2021-03-10 DIAGNOSIS — M25561 Pain in right knee: Secondary | ICD-10-CM

## 2021-03-10 MED ORDER — LIDOCAINE HCL 1 % IJ SOLN
3.0000 mL | INTRAMUSCULAR | Status: AC | PRN
  Administered 2021-03-10: 3 mL

## 2021-03-10 MED ORDER — METHYLPREDNISOLONE ACETATE 40 MG/ML IJ SUSP
40.0000 mg | INTRAMUSCULAR | Status: AC | PRN
  Administered 2021-03-10: 40 mg via INTRA_ARTICULAR

## 2021-03-10 NOTE — Progress Notes (Signed)
Office Visit Note   Patient: Breona Cherubin           Date of Birth: Jan 04, 1976           MRN: [id number] Visit Date: 03/10/2021              Requested by: Tomasa Hose, NP [address] [place],  [place] [id number] PCP: Tomasa Hose, NP   Assessment & Plan: Visit Diagnoses:  1. Acute pain of right knee     Plan: The last measure of conservative treatment would be trying a steroid injection in her right knee today and she agrees with this treatment plan.  She tolerated it well.  I did describe the risk and benefits of steroid injections.  I would like to see her back in 2 weeks.  If she is still having mechanical symptoms with her knee, at that point a MRI of the knee would be warranted.  Follow-Up Instructions: Return in about 2 weeks (around 03/24/2021).   Orders:  Orders Placed This Encounter  Procedures  . Large Joint Inj  . XR Knee 1-2 Views Right   No orders of the defined types were placed in this encounter.     Procedures: Large Joint Inj: R knee on 03/10/2021 8:57 AM Indications: diagnostic evaluation and pain Details: 22 G 1.5 in needle, superolateral approach  Arthrogram: No  Medications: 3 mL lidocaine 1 %; 40 mg methylPREDNISolone acetate 40 MG/ML Outcome: tolerated well, no immediate complications Procedure, treatment alternatives, risks and benefits explained, specific risks discussed. Consent was given by the patient. Immediately prior to procedure a time out was called to verify the correct patient, procedure, equipment, support staff and site/side marked as required. Patient was prepped and draped in the usual sterile fashion.       Clinical Data: No additional findings.   Subjective: Chief Complaint  Patient presents with  . Right Knee - Pain  The patient is an active 45 year old female who comes in with a 1 month history of right knee pain.  There was no specific injury but she started to have pain after a workout routine.   Most of her pain is in the back and posterior medial knee where she points to on her right knee.  She is also had some swelling with the right knee.  She is worked on activity modification already.  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  She is avoid high impact aerobic activities and pivoting activities.  She is never had surgery on that knee before.  She has not injured that right knee before.  She has no active acute medical issues and she is not a diabetic.  She is taking oral anti-inflammatories and topical anti-inflammatories on the right knee.  HPI  Review of Systems She currently denies any headache, chest pain, shortness of breath, fever, chills, nausea, vomiting  Objective: Vital Signs: There were no vitals taken for this visit.  Physical Exam She is alert and orient x3 and in no acute distress Ortho Exam Examination of her right knee does show a mild effusion.  She does have a positive McMurray's exam to the medial compartment of the knee and posterior medial pain.  Her range of motion is full but painful. Specialty Comments:  No specialty comments available.  Imaging: XR Knee 1-2 Views Right  Result Date: 03/10/2021 2 views of the right knee show no acute findings and  a well-maintained joint space.    PMFS History: There are no problems to display for this patient.  Past Medical History:  Diagnosis Date  . Anxiety     Family History  Problem Relation Age of Onset  . Breast cancer Mother 49    Past Surgical History:  Procedure Laterality Date  . DILITATION & CURRETTAGE/HYSTROSCOPY WITH HYDROTHERMAL ABLATION N/A 09/04/2020   Procedure: DILATATION & CURETTAGE/HYSTEROSCOPY WITH HYDROTHERMAL ABLATION;  Surgeon: Christophe Louis, MD;  Location: [facility];  Service: Gynecology;  Laterality: N/A;  . KNEE ARTHROSCOPY     Social History   Occupational History  . Not on file  Tobacco Use  .  Smoking status: Never Smoker  . Smokeless tobacco: Never Used  Vaping Use  . Vaping Use: Never used  Substance and Sexual Activity  . Alcohol use: Yes    Comment: casual  . Drug use: Never  . Sexual activity: Not on file

## 2021-03-26 ENCOUNTER — Ambulatory Visit: Payer: 59 | Admitting: Orthopaedic Surgery

## 2021-04-01 ENCOUNTER — Ambulatory Visit: Payer: 59 | Admitting: Orthopaedic Surgery

## 2021-04-02 ENCOUNTER — Encounter: Payer: Self-pay | Admitting: Orthopaedic Surgery

## 2021-04-02 ENCOUNTER — Ambulatory Visit (INDEPENDENT_AMBULATORY_CARE_PROVIDER_SITE_OTHER): Payer: 59 | Admitting: Orthopaedic Surgery

## 2021-04-02 ENCOUNTER — Other Ambulatory Visit: Payer: Self-pay

## 2021-04-02 DIAGNOSIS — M25561 Pain in right knee: Secondary | ICD-10-CM | POA: Diagnosis not present

## 2021-04-02 DIAGNOSIS — M1611 Unilateral primary osteoarthritis, right hip: Secondary | ICD-10-CM | POA: Insufficient documentation

## 2021-04-02 NOTE — Progress Notes (Signed)
HPI: Mrs. Bobak returns today follow-up status post right knee injection on 03/10/2021.  She states the injection helped until about 2 or 3 days ago.  In the pain returned.  She is having giving way of the knee and swelling.  Denies any other mechanical symptoms.  She is taken Advil and Voltaren gel.  She does state these are helpful.  She again reports no particular injury but she has been doing a workout program and was doing this at the time when her knee began bothering her.  Review of systems: See HPI otherwise negative noncontributory.  Physical exam: General well-developed well-nourished pleasant female in no acute distress.   Psych: Alert and oriented x3 Right knee: Excellent range of motion with slight patellofemoral crepitus.  Valgus varus stressing reveals no laxity with valgus stressing causes pain medial joint line.  She is tender along medial joint line.  Positive McMurray's.  No abnormal warmth erythema.  Only slight edema without effusion.  Impression: Right knee pain  Plan: Given patient's recurrent pain, clinical exam and mechanical symptoms recommend MRI.  Also the fact the patient is failed conservative treatment which is included NSAIDs cortisone injection modification of activities without resolution of her pain.  MRI of the right knee to rule out meniscal tear.  She will follow-up after the MRI to go over results and discuss further treatment.  Questions encouraged and answered

## 2021-04-14 ENCOUNTER — Other Ambulatory Visit: Payer: Self-pay

## 2021-04-14 ENCOUNTER — Ambulatory Visit
Admission: RE | Admit: 2021-04-14 | Discharge: 2021-04-14 | Disposition: A | Discharge status: Home / Self Care | Payer: 59 | Source: Ambulatory Visit | Attending: Orthopaedic Surgery | Admitting: Orthopaedic Surgery

## 2021-04-14 DIAGNOSIS — M25561 Pain in right knee: Secondary | ICD-10-CM

## 2021-04-22 ENCOUNTER — Encounter: Payer: Self-pay | Admitting: Orthopaedic Surgery

## 2021-04-22 ENCOUNTER — Ambulatory Visit (INDEPENDENT_AMBULATORY_CARE_PROVIDER_SITE_OTHER): Payer: 59 | Admitting: Orthopaedic Surgery

## 2021-04-22 ENCOUNTER — Other Ambulatory Visit: Payer: Self-pay

## 2021-04-22 DIAGNOSIS — M25561 Pain in right knee: Secondary | ICD-10-CM | POA: Diagnosis not present

## 2021-04-22 NOTE — Progress Notes (Signed)
The patient comes in today to go over an MRI of her right knee.  She had an acute injury to that knee with a twisting type of activity.  Since then things have calmed down but she certainly having some issues with the knee but no swelling.  This point with pivoting type activities and medial aspect of the knee.  Today's knee exam shows that her knees hyperextend.  There is no effusion.  Range of motion is excellent and the knee is ligamentously stable on the right side.  Her MRI does show a small radial tear of the medial meniscus but is an incomplete tear and small.  There is a tiny Baker's cyst but otherwise the cartilage and ligaments throughout the knee are intact and the cartilage is preserved.  I recommended just activity modification and quad strengthening exercises.  I would not recommend arthroscopic surgery at this standpoint given that this is a very small incomplete tear and he would rather preserve the meniscus is much as he can.  She understands as well and I showed her knee model.  All questions and concerns were answered addressed.  Follow-up can be as needed.  I recommend continued activities as she tolerates.  Certainly if things worsen she will let us know.

## 2021-04-30 IMAGING — MG DIGITAL SCREENING BILAT W/ TOMO W/ CAD
6 of 10 series · 6 of 30 positions shown · non-contrast
Comparison: Previous exam(s).

CLINICAL DATA: Screening.

EXAM:
DIGITAL SCREENING BILATERAL MAMMOGRAM WITH TOMO AND CAD

[L CC synth-2D]
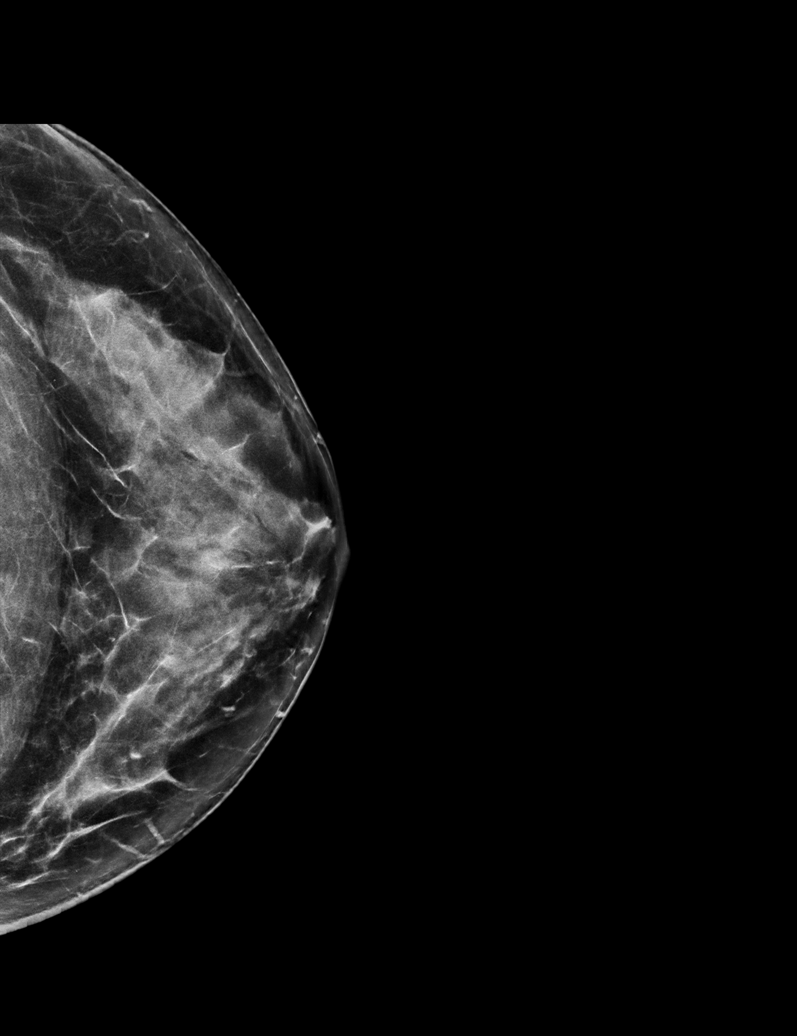

[R MLO synth-2D]
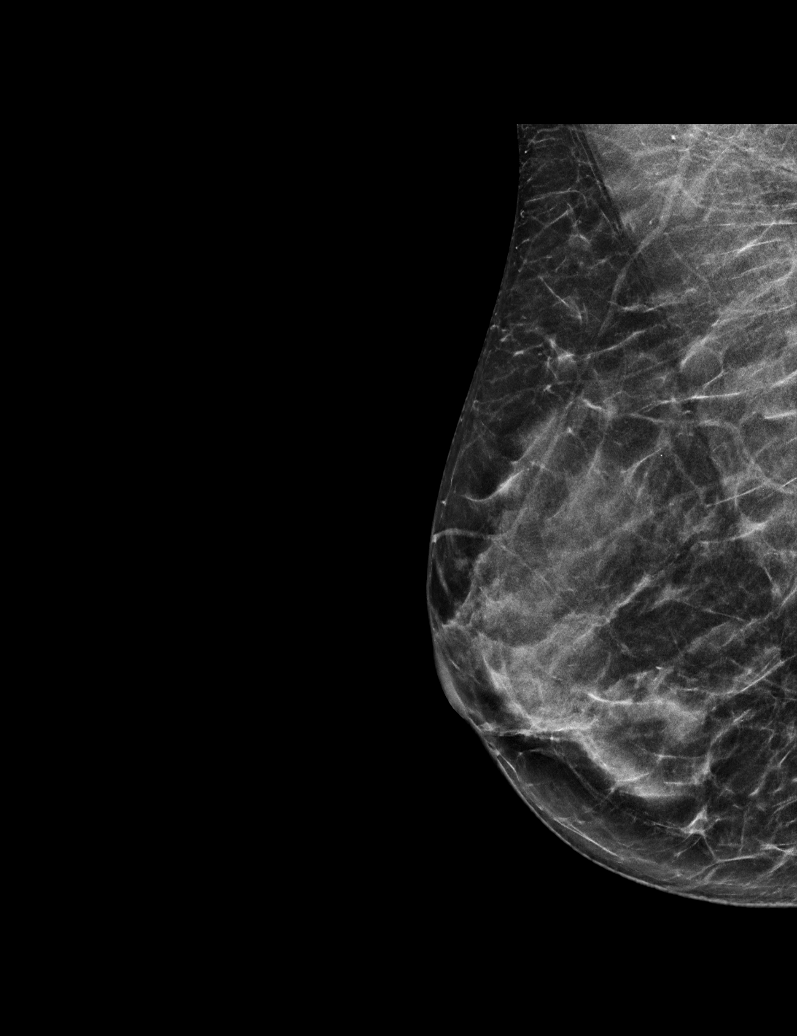

[R CC synth-2D]
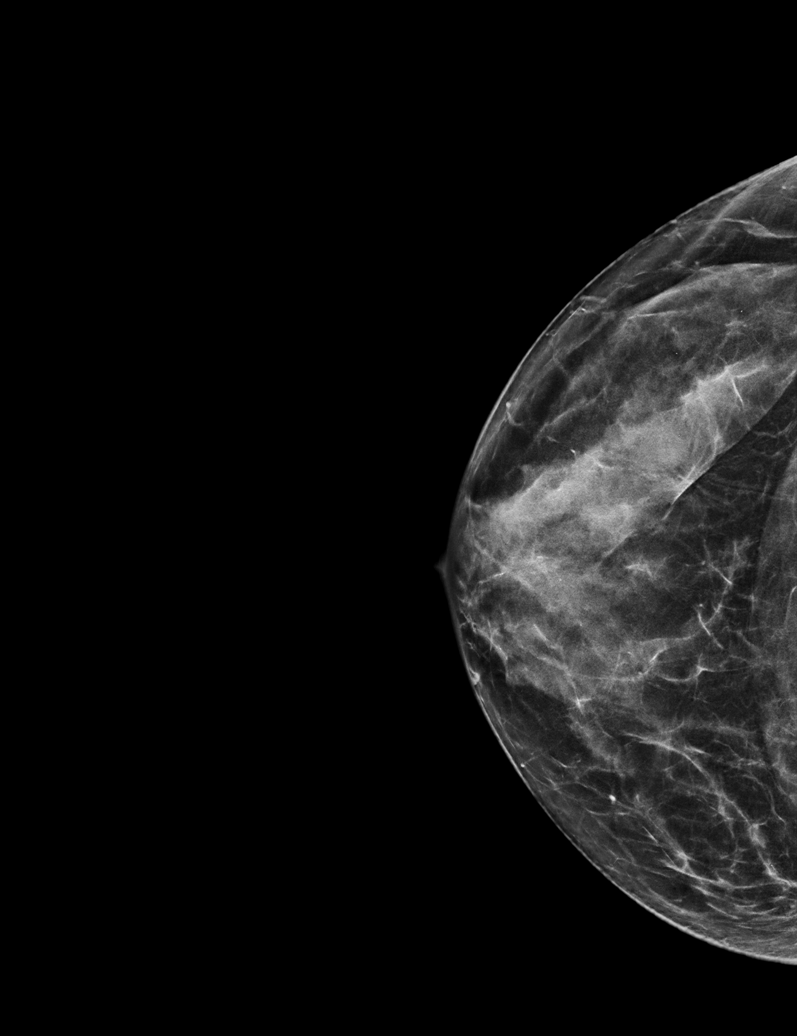

[R XCCL synth-2D]
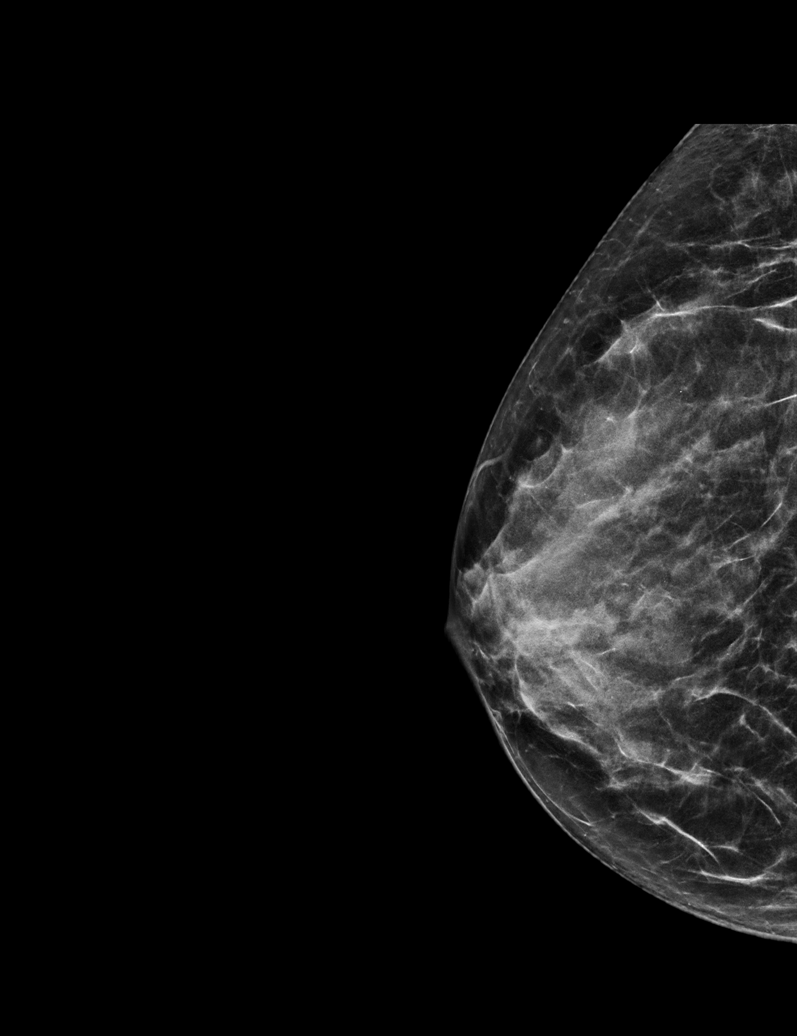

[L MLO synth-2D]
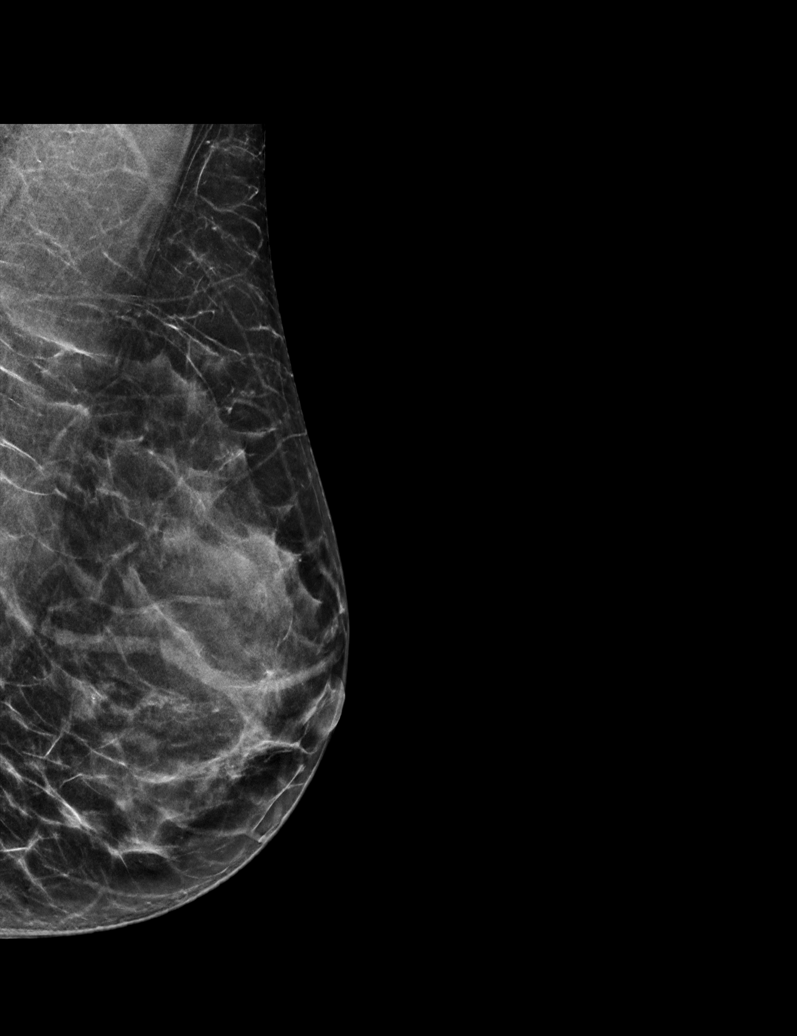

[R MLO tomo · tomo slice 27/54.0]
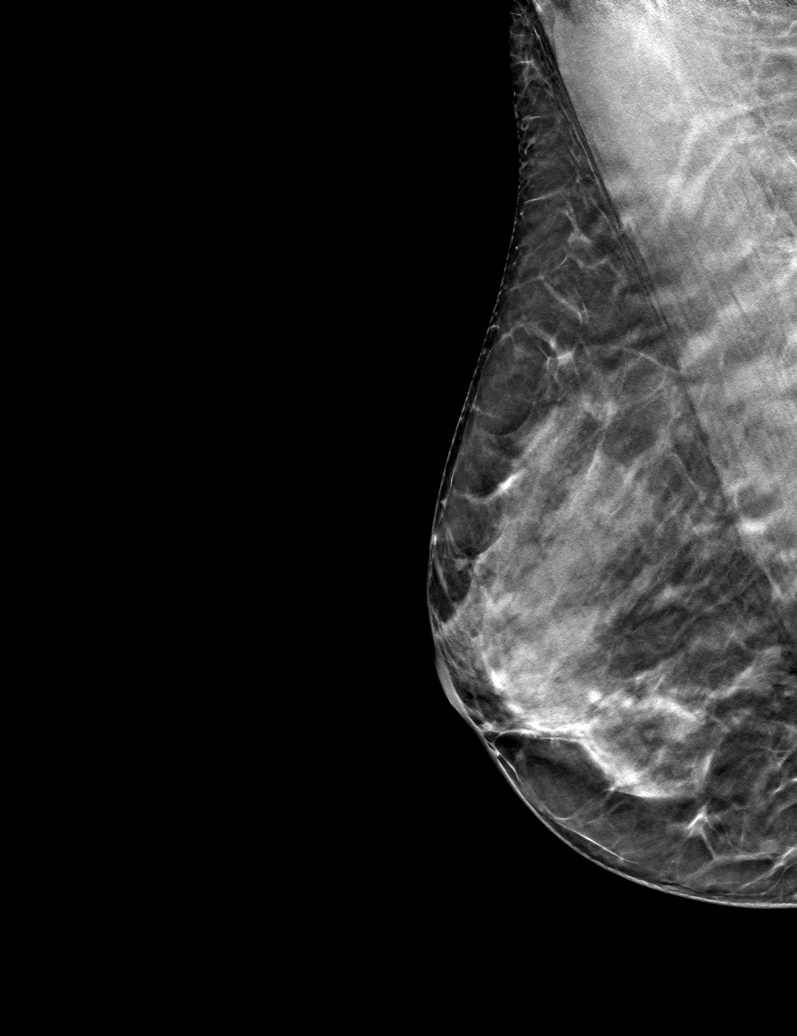

[6 of 30 positions shown; findings below may reference images not displayed]

ACR Breast Density Category c: The breast tissue is heterogeneously
dense, which may obscure small masses.
FINDINGS: There are no findings suspicious for malignancy. Images were
processed with CAD.
IMPRESSION: No mammographic evidence of malignancy. A result letter of this
screening mammogram will be mailed directly to the patient.

RECOMMENDATION:
Screening mammogram in one year. (Code:[id number]-U-LHB)

BI-RADS CATEGORY  1: Negative.

## 2021-07-21 ENCOUNTER — Other Ambulatory Visit: Payer: Self-pay

## 2021-07-21 ENCOUNTER — Encounter: Payer: Self-pay | Admitting: Plastic Surgery

## 2021-07-21 ENCOUNTER — Ambulatory Visit (INDEPENDENT_AMBULATORY_CARE_PROVIDER_SITE_OTHER): Payer: Self-pay | Admitting: Plastic Surgery

## 2021-07-21 DIAGNOSIS — Z719 Counseling, unspecified: Secondary | ICD-10-CM

## 2021-07-21 NOTE — Progress Notes (Addendum)
Patient ID: Valerie Steele, female    DOB: 11-29-1976, 45 y.o.   MRN: [id number]   Chief Complaint  Patient presents with   Advice Only     The patient is a 45 year old female here for evaluation of her abdomen and breast.  The patient has had 2 children with 2 C-sections.  Their dates of birth were 2007 and 2010.  She is not planning on having anymore children.  She has had several foot surgeries, an ablation and a knee surgery.  She had gestational diabetes and has a strong history of diabetes but currently does not have active diabetes.  Her ideal weight is around 105 to 110 pounds.  She is accompanied by her husband.  She is a 34B or C cup.  She does not want to be any bigger but feels like she has ptosis.  She has some hyperpigmentation of her face and is very active and spends a lot of time in the sun.  She is a Fitzpatrick 3 or 4 skin type.  She has a nicely healed C-section scar.  She has a little bit of excess fat in the abdominal area and laxity of the lower abdominal area.  She has a little bit of loss of upper pole fullness of her breast with ptosis.  No lumps or bumps in her past mammogram was negative.  Her mom had breast cancer in her 40s.   Review of Systems  Constitutional:  Negative for activity change and appetite change.  Eyes: Negative.   Respiratory:  Negative for chest tightness and shortness of breath.   Cardiovascular:  Negative for leg swelling.  Gastrointestinal:  Negative for abdominal distention and abdominal pain.  Endocrine: Negative.   Genitourinary: Negative.   Musculoskeletal: Negative.   Skin:  Negative for color change and wound.  Hematological: Negative.   Psychiatric/Behavioral: Negative.     Past Medical History:  Diagnosis Date   Anxiety     Past Surgical History:  Procedure Laterality Date   DILITATION & CURRETTAGE/HYSTROSCOPY WITH HYDROTHERMAL ABLATION N/A 09/04/2020   Procedure: DILATATION & CURETTAGE/HYSTEROSCOPY WITH HYDROTHERMAL  ABLATION;  Surgeon: Christophe Louis, MD;  Location: [place];  Service: Gynecology;  Laterality: N/A;   KNEE ARTHROSCOPY        Current Outpatient Medications:    buPROPion (WELLBUTRIN XL) 300 MG 24 hr tablet, Take 300 mg by mouth daily., Disp: , Rfl:    Cholecalciferol (D3 VITAMIN PO), Take by mouth., Disp: , Rfl:    ibuprofen (ADVIL) 800 MG tablet, Take 1 tablet (800 mg total) by mouth every 8 (eight) hours as needed., Disp: 30 tablet, Rfl: 0   Objective:   There were no vitals filed for this visit.  Physical Exam Vitals and nursing note reviewed.  Constitutional:      Appearance: Normal appearance.  HENT:     Head: Normocephalic and atraumatic.  Cardiovascular:     Rate and Rhythm: Normal rate.  Pulmonary:     Effort: Pulmonary effort is normal. No respiratory distress.     Breath sounds: No wheezing.  Abdominal:     General: Abdomen is flat.     Palpations: There is no mass.     Tenderness: There is no abdominal tenderness.     Hernia: No hernia is present.  Musculoskeletal:        General: No swelling or deformity.  Skin:    General: Skin is warm.     Capillary Refill: Capillary refill takes  less than 2 seconds.     Coloration: Skin is not jaundiced or pale.     Findings: No bruising or lesion.  Neurological:     General: No focal deficit present.     Mental Status: She is alert and oriented to person, place, and time.  Psychiatric:        Mood and Affect: Mood normal.        Behavior: Behavior normal.        Thought Content: Thought content normal.    Assessment & Plan:  Encounter for counseling  The patient is a very good candidate for a mini abdominoplasty with liposuction and bilateral mastopexy.  We will provide her with a quote and we can certainly talk more if she has any other questions.  She would also do very well with laser for her face.  Pictures were obtained of the patient and placed in the chart with the patient's or guardian's  permission.   [place], DO

## 2021-08-05 ENCOUNTER — Institutional Professional Consult (permissible substitution): Payer: 59 | Admitting: Plastic Surgery

## 2021-08-15 DIAGNOSIS — Z719 Counseling, unspecified: Secondary | ICD-10-CM

## 2021-10-14 ENCOUNTER — Other Ambulatory Visit: Payer: 59 | Admitting: Plastic Surgery

## 2021-10-17 ENCOUNTER — Encounter: Payer: Self-pay | Admitting: Plastic Surgery

## 2021-10-17 ENCOUNTER — Other Ambulatory Visit: Payer: Self-pay

## 2021-10-17 ENCOUNTER — Ambulatory Visit (INDEPENDENT_AMBULATORY_CARE_PROVIDER_SITE_OTHER): Payer: Self-pay | Admitting: Plastic Surgery

## 2021-10-17 DIAGNOSIS — Z719 Counseling, unspecified: Secondary | ICD-10-CM

## 2021-10-17 NOTE — Progress Notes (Signed)
[place]  Preoperative Dx: hyperpigmentation of face  Postoperative Dx:  same  Procedure: laser to face   Anesthesia: none  Description of Procedure:  Risks and complications were explained to the patient. Consent was confirmed and signed. Time out was called and all information was confirmed to be correct. The area  area was prepped with alcohol and wiped dry. The BBL laser was set at 6 J/cm2 with 590 nm. The face was lasered. The 515 nm was then placed and set at 5-6 J/cm2 and the face lasered. The patient tolerated the procedure well and there were no complications. The patient is to follow up in 4 weeks.

## 2021-11-04 ENCOUNTER — Ambulatory Visit (INDEPENDENT_AMBULATORY_CARE_PROVIDER_SITE_OTHER): Payer: 59 | Admitting: Physician Assistant

## 2021-11-04 ENCOUNTER — Other Ambulatory Visit: Payer: Self-pay

## 2021-11-04 VITALS — BP 123/69 | HR 63 | Ht 59.0 in | Wt 117.4 lb

## 2021-11-04 DIAGNOSIS — Z719 Counseling, unspecified: Secondary | ICD-10-CM

## 2021-11-04 MED ORDER — TRAMADOL HCL 50 MG PO TABS: 50.0000 mg | ORAL_TABLET | Freq: Three times a day (TID) | ORAL | 0 refills | Status: AC | PRN

## 2021-11-04 MED ORDER — CEPHALEXIN 500 MG PO CAPS: 500.0000 mg | ORAL_CAPSULE | Freq: Four times a day (QID) | ORAL | 0 refills | Status: AC

## 2021-11-04 MED ORDER — ONDANSETRON 4 MG PO TBDP: 4.0000 mg | ORAL_TABLET | Freq: Three times a day (TID) | ORAL | 0 refills | Status: AC | PRN

## 2021-11-04 NOTE — Progress Notes (Signed)
Patient ID: Valerie Steele, female    DOB: [birth year], 45 y.o.   MRN: [id number]  Chief Complaint  Patient presents with   Pre-op Exam      ICD-10-CM   1. Encounter for counseling  Z71.9        History of Present Illness: Valerie Steele is a 45 y.o.  female  with a history of multiparity and breast ptosis.  She presents for preoperative evaluation for upcoming procedure, mini abdominoplasty with liposuction and bilateral mastopexy, scheduled for 11/24/2021 with Dr. Marla Roe.  The patient has not had problems with anesthesia.  She simply reports that she was tired and slow to "come out" last time she was under anesthesia for C-section.  She denies any personal or family history of blood clots or clotting disorder.  No personal history of cancer.  She is not on any birth control or hormone replacement.  She did sustain trauma to her face which resulted in broken nose, but that was approximately 2-3 months ago.  No other recent injuries, illness, or infection.  She reports that Percocet makes her nauseated.  She is not a smoker and is not diabetic.  Endorses spider veins, but denies varicosities.  Summary of Previous Visit: Patient was seen for consultation 07/21/2021 by Dr. Marla Roe.  She is a 34 B/C cup, does not want to be any larger.  She expressed that she would like to address her ptosis.  She also complained of excess fat/laxity in the abdominal area.  Deemed a good candidate for mini abdominoplasty with liposuction and bilateral mastopexy.  She expressed desire to proceed with surgical intervention.  Pictures were obtained.  Job: No FMLA required.  PMH Significant for: Multiparity, breast ptosis.   Past Medical History: Allergies: Allergies  Allergen Reactions   Percocet [Oxycodone-Acetaminophen] Nausea And Vomiting    Current Medications:  Current Outpatient Medications:    buPROPion (WELLBUTRIN XL) 300 MG 24 hr tablet, Take 300 mg by mouth daily., Disp: , Rfl:     ibuprofen (ADVIL) 800 MG tablet, Take 1 tablet (800 mg total) by mouth every 8 (eight) hours as needed., Disp: 30 tablet, Rfl: 0   Cholecalciferol (D3 VITAMIN PO), Take by mouth. (Patient not taking: Reported on 11/04/2021), Disp: , Rfl:   Past Medical Problems: Past Medical History:  Diagnosis Date   Anxiety     Past Surgical History: Past Surgical History:  Procedure Laterality Date   DILITATION & CURRETTAGE/HYSTROSCOPY WITH HYDROTHERMAL ABLATION N/A 09/04/2020   Procedure: DILATATION & CURETTAGE/HYSTEROSCOPY WITH HYDROTHERMAL ABLATION;  Surgeon: Christophe Louis, MD;  Location: [place];  Service: Gynecology;  Laterality: N/A;   KNEE ARTHROSCOPY      Social History: Social History   Socioeconomic History   Marital status: Married    Spouse name: Not on file   Number of children: Not on file   Years of education: Not on file   Highest education level: Not on file  Occupational History   Not on file  Tobacco Use   Smoking status: Never   Smokeless tobacco: Never  Vaping Use   Vaping Use: Never used  Substance and Sexual Activity   Alcohol use: Yes    Comment: casual   Drug use: Never   Sexual activity: Not on file  Other Topics Concern   Not on file  Social History Narrative   Not on file   Social Determinants of Health   Financial Resource Strain: Not on file  Food Insecurity: Not on  file  Transportation Needs: Not on file  Physical Activity: Not on file  Stress: Not on file  Social Connections: Not on file  Intimate Partner Violence: Not on file    Family History: Family History  Problem Relation Age of Onset   Breast cancer Mother 54    Review of Systems: ROS Denies recent injuries, illnesses, or hospitalizations in the past 30 days.  Physical Exam: Vital Signs BP 123/69 (BP Location: Left Arm, Patient Position: Sitting, Cuff Size: Small)   Pulse 63   Ht 4\' 11"  (1.499 m)   Wt 117 lb 6.4 oz (53.3 kg)   SpO2 100%   BMI 23.71 kg/m    Physical Exam Constitutional:      General: Not in acute distress.    Appearance: Normal appearance. Not ill-appearing.  HENT:     Head: Normocephalic and atraumatic.  Eyes:     Pupils: Pupils are equal, round Neck:     Musculoskeletal: Normal range of motion.  No lower extremity swelling.  Ambulatory. Cardiovascular:     Rate and Rhythm: Normal rate    Pulses: Normal pulses.  Pulmonary:     Effort: Pulmonary effort is normal. No respiratory distress.  Abdominal:     General: Abdomen is flat. There is no distension.  Musculoskeletal: Normal range of motion.  Skin:    General: Skin is warm and dry.     Findings: No erythema or rash.  Neurological:     General: No focal deficit present.     Mental Status: Alert and oriented to person, place, and time. Mental status is at baseline.     Motor: No weakness.  Psychiatric:        Mood and Affect: Mood normal.        Behavior: Behavior normal.    Assessment/Plan: The patient is scheduled for mini abdominoplasty with liposuction and bilateral mastopexy 11/24/2021 with Dr. Marla Roe.  Risks, benefits, and alternatives of procedure discussed, questions answered and consent obtained.    Smoking Status: Non-smoker. Last Mammogram: 12/13/2020; Results: BI-RADS Category 1, negative.  Caprini Score: 3; Risk Factors include: Age and length of planned surgery. Recommendation for mechanical prophylaxis. Encourage early ambulation.   Pictures obtained: 07/21/2021  Post-op Rx sent to pharmacy: Tramadol, Zofran, Keflex.  Reported that Percocet makes her nauseated.  Patient was provided with the General Surgical Risk consent document and Pain Medication Agreement prior to their appointment.  They had adequate time to read through the risk consent documents and Pain Medication Agreement. We also discussed them in person together during this preop appointment. All of their questions were answered to their satisfaction.  Recommended calling if they  have any further questions.  Risk consent form and Pain Medication Agreement to be scanned into patient's chart.  The risk that can be encountered for this procedure were discussed and include the following but not limited to these: asymmetry, fluid accumulation, firmness of the tissue, skin loss, decrease or no sensation, fat necrosis, bleeding, infection, healing delay.  Deep vein thrombosis, cardiac and pulmonary complications are risks to any procedure.  There are risks of anesthesia, changes to skin sensation and injury to nerves or blood vessels.  The muscle can be temporarily or permanently injured.  You may have an allergic reaction to tape, suture, glue, blood products which can result in skin discoloration, swelling, pain, skin lesions, poor healing.  Any of these can lead to the need for revisonal surgery or stage procedures.  Weight gain and weigh loss can  also effect the long term appearance. The results are not guaranteed to last a lifetime.  Future surgery may be required.    The risk that can be encountered with mastopexy/breast lift were discussed and include the following but not limited to these:  Breast asymmetry, fluid accumulation, firmness of the breast, inability to breast feed, loss of nipple or areola, skin loss, decrease or no nipple sensation, fat necrosis of the breast tissue, bleeding, infection, healing delay.  There are risks of anesthesia, changes to skin sensation and injury to nerves or blood vessels.  The muscle can be temporarily or permanently injured.  You may have an allergic reaction to tape, suture, glue, blood products which can result in skin discoloration, swelling, pain, skin lesions, poor healing.  Any of these can lead to the need for revisonal surgery or stage procedures.  A mastopexy has potential to interfere with diagnostic procedures.  Nipple or breast piercing can increase risks of infection.  This procedure is best done when the breast is fully developed.   Changes in the breast will continue to occur over time.  Pregnancy can alter the outcomes of previous breast surgery, weight gain and weigh loss can also effect the long term appearance.    Electronically signed by: Krista Blue, PA-C 11/04/2021 10:49 AM

## 2021-11-18 ENCOUNTER — Other Ambulatory Visit: Payer: Self-pay

## 2021-11-18 ENCOUNTER — Ambulatory Visit (INDEPENDENT_AMBULATORY_CARE_PROVIDER_SITE_OTHER): Payer: Self-pay | Admitting: Plastic Surgery

## 2021-11-18 ENCOUNTER — Encounter: Payer: Self-pay | Admitting: Plastic Surgery

## 2021-11-18 DIAGNOSIS — Z719 Counseling, unspecified: Secondary | ICD-10-CM

## 2021-11-18 NOTE — Progress Notes (Signed)
[place]  Preoperative Dx: Hyperpigmentation and redness of face  Postoperative Dx:  same  Procedure: laser to face   Anesthesia: none  Description of Procedure:  Risks and complications were explained to the patient. Consent was confirmed and signed.  Eye protection placed. Time out was called and all information was confirmed to be correct. The area  area was prepped with alcohol and wiped dry. The BBL laser was set at 590 nm 6 J/cm2. The face was lasered. The 515 nm was then placed and set at 6 J.  The face was lasered.  The patient tolerated the procedure well and there were no complications. The patient is to follow up in 4 weeks.

## 2021-11-20 ENCOUNTER — Other Ambulatory Visit: Payer: Self-pay | Admitting: Nurse Practitioner

## 2021-11-20 DIAGNOSIS — Z1231 Encounter for screening mammogram for malignant neoplasm of breast: Secondary | ICD-10-CM

## 2021-11-21 HISTORY — PX: AUGMENTATION MAMMAPLASTY: SUR837

## 2021-11-25 ENCOUNTER — Other Ambulatory Visit: Payer: 59 | Admitting: Plastic Surgery

## 2021-12-02 ENCOUNTER — Telehealth: Payer: Self-pay

## 2021-12-02 ENCOUNTER — Other Ambulatory Visit (INDEPENDENT_AMBULATORY_CARE_PROVIDER_SITE_OTHER): Payer: 59 | Admitting: Physician Assistant

## 2021-12-02 DIAGNOSIS — Z9889 Other specified postprocedural states: Secondary | ICD-10-CM

## 2021-12-02 MED ORDER — METHOCARBAMOL 500 MG PO TABS: 500.0000 mg | ORAL_TABLET | Freq: Two times a day (BID) | ORAL | 0 refills | Status: AC | PRN

## 2021-12-02 NOTE — Progress Notes (Signed)
Patient called with complaints of middle lumbar-thoracic region back discomfort, worse at night when lying down.  She states that this is similar to how she felt when she was pregnant.  She assures me that it is muscular.  Denies any associated fevers, chest pain, shortness of breath, cough, unilateral distribution, or urinary symptoms.  She is taking ibuprofen, but is also hoping that she could have some relief with a muscle relaxer.  She states that she is not taking the narcotic pain pills because she does not like the way that they make her feel.  Will prescribe short course of Robaxin.  She will call should she develop any new or worsening symptoms.

## 2021-12-02 NOTE — Telephone Encounter (Signed)
Spoke to Valerie Steele and he stated that he would call a muscle relaxer into pt's pharmacy but also wanted to have a conversation with her to ask more probing questions to rule out other causes of the back pain.

## 2021-12-02 NOTE — Telephone Encounter (Signed)
Patient would like a prescription for a muscle relaxer. She states she is no longer taking any pain medication but is experiencing back pain that she thinks the muscle relaxer will help with. Confirmed pharmacy in chart as correct.

## 2021-12-04 ENCOUNTER — Encounter: Payer: Self-pay | Admitting: Plastic Surgery

## 2021-12-04 ENCOUNTER — Ambulatory Visit (INDEPENDENT_AMBULATORY_CARE_PROVIDER_SITE_OTHER): Payer: 59 | Admitting: Plastic Surgery

## 2021-12-04 ENCOUNTER — Other Ambulatory Visit: Payer: Self-pay

## 2021-12-04 DIAGNOSIS — Z719 Counseling, unspecified: Secondary | ICD-10-CM

## 2021-12-04 NOTE — Progress Notes (Signed)
The patient is a 45 year old female here for follow-up after undergoing breast and abdominal surgery last week.  All areas are looking very good.  I went ahead and remove the drain from her abdomen.  I do not see any areas of seroma or hematoma.  No areas of infection.  The swelling and the bruising is as expected.  She should go into a spanks and sports bra.  I think she will feel more comfortable.  Continue using the Lipo foam as able.  Call with any questions or concerns.

## 2021-12-05 ENCOUNTER — Encounter: Payer: 59 | Admitting: Plastic Surgery

## 2021-12-16 ENCOUNTER — Encounter: Payer: 59 | Admitting: Surgical

## 2021-12-16 ENCOUNTER — Other Ambulatory Visit: Payer: Self-pay

## 2021-12-16 ENCOUNTER — Ambulatory Visit (INDEPENDENT_AMBULATORY_CARE_PROVIDER_SITE_OTHER): Payer: 59 | Admitting: Surgical

## 2021-12-16 DIAGNOSIS — Z9889 Other specified postprocedural states: Secondary | ICD-10-CM

## 2021-12-16 DIAGNOSIS — Z719 Counseling, unspecified: Secondary | ICD-10-CM

## 2021-12-16 NOTE — Progress Notes (Signed)
46 year old female here for follow-up after breast and abdominal surgery with Dr. Marla Roe on 11/24/2021.  She underwent mini abdominoplasty with liposuction and bilateral mastopexy.  She is 3 weeks postop.  She presents today with her husband.  She reports overall she is doing well, she has been wearing compressive garments.  She has been avoiding strenuous activities.  She has some questions about ongoing postoperative care.  She is not having any infectious symptoms.  Chaperone present on exam On exam bilateral NAC's are viable, bilateral breast incisions are intact and healing well.  There is no erythema or cellulitic changes noted.  On exam of abdomen, dressings are still in place.  There is no subcutaneous fluid collections noted.  No erythema or cellulitic changes.  After dressings were removed, well-healed incision was noted.  Patient had some discomfort with removal of her breast and abdomen dressings and got lightheaded.  She was allowed time to recover from this and ultimately felt much better prior to leaving the office.  We discussed restrictions and ongoing postoperative care.  We discussed scheduling additional follow-up in 3 weeks for reevaluation.  There is no signs of infection on exam.  Recommend calling with questions or concerns.

## 2021-12-30 ENCOUNTER — Ambulatory Visit (INDEPENDENT_AMBULATORY_CARE_PROVIDER_SITE_OTHER): Payer: Self-pay | Admitting: Plastic Surgery

## 2021-12-30 ENCOUNTER — Other Ambulatory Visit: Payer: Self-pay

## 2021-12-30 ENCOUNTER — Encounter: Payer: Self-pay | Admitting: Plastic Surgery

## 2021-12-30 DIAGNOSIS — Z719 Counseling, unspecified: Secondary | ICD-10-CM

## 2021-12-30 NOTE — Progress Notes (Signed)
[place]  Preoperative Dx: hyperpigmentation of face  Postoperative Dx:  same  Procedure: laser to face   Anesthesia: none  Description of Procedure:  Risks and complications were explained to the patient. Consent was confirmed and signed. Eye protection was placed. Time out was called and all information was confirmed to be correct. The area  area was prepped with alcohol and wiped dry. The BBL laser was set at 560nm 7 J/cm2. Then 515 nm at 6 J. The face was lasered. The patient tolerated the procedure well and there were no complications. The patient is to follow up in 4 weeks.

## 2022-01-06 ENCOUNTER — Other Ambulatory Visit: Payer: Self-pay

## 2022-01-06 ENCOUNTER — Ambulatory Visit (INDEPENDENT_AMBULATORY_CARE_PROVIDER_SITE_OTHER): Payer: 59 | Admitting: Surgical

## 2022-01-06 ENCOUNTER — Other Ambulatory Visit: Payer: 59 | Admitting: Plastic Surgery

## 2022-01-06 DIAGNOSIS — Z9889 Other specified postprocedural states: Secondary | ICD-10-CM

## 2022-01-06 DIAGNOSIS — Z719 Counseling, unspecified: Secondary | ICD-10-CM

## 2022-01-06 NOTE — Progress Notes (Signed)
46 year old female here for follow-up after breast and abdominal surgery with Dr. Marla Roe on 11/24/2021.  She had a mini abdominoplasty with liposuction and bilateral mastopexy.  She is about 6 weeks postop.  She is doing well, reports some tenderness to her abdomen after a lot of walking.  She is very pleased with her result.  She is not having any infectious symptoms.  Chaperone present on exam On exam bilateral breast incisions are intact, bilateral NAC's are viable.  Bilateral breasts are symmetric. On exam abdomen incision is healing well, no erythema or cellulitic changes.  No subcutaneous fluid collections noted with palpation.  She can begin returning to normal activities, increase intensity of exercise as able.  Recommend continue wear sports bra and abdominal compression throughout the day for the next few weeks.  She can transition to wearing a normal bra in the next few weeks as tolerated.  Recommend compression garments when exercising until she feels more comfortable.  No signs of infection on exam. Pictures were obtained of the patient and placed in the chart with the patient's or guardian's permission.

## 2022-02-17 ENCOUNTER — Ambulatory Visit: Payer: Self-pay | Admitting: Plastic Surgery

## 2022-02-17 ENCOUNTER — Encounter: Payer: Self-pay | Admitting: Plastic Surgery

## 2022-02-17 ENCOUNTER — Other Ambulatory Visit: Payer: Self-pay

## 2022-02-17 DIAGNOSIS — Z719 Counseling, unspecified: Secondary | ICD-10-CM

## 2022-02-17 NOTE — Progress Notes (Signed)
Preoperative Dx: hyperpigmentation of face ? ?Postoperative Dx:  same ? ?Procedure: laser to face  ? ?Anesthesia: none ? ?Description of Procedure:  ?Risks and complications were explained to the patient. Consent was confirmed and signed. Eye protection was placed. Time out was called and all information was confirmed to be correct. The area  area was prepped with alcohol and wiped dry. The BBL laser was set at 590 nm at 7 J/cm2. The 515 nm was set at 6 J. The face was lasered. The patient tolerated the procedure well and there were no complications. The patient is to follow up in 4 weeks. ? ? ?

## 2022-07-03 ENCOUNTER — Ambulatory Visit: Payer: 59

## 2022-07-28 ENCOUNTER — Ambulatory Visit: Payer: 59

## 2022-08-20 ENCOUNTER — Ambulatory Visit: Payer: 59

## 2022-09-17 ENCOUNTER — Ambulatory Visit
Admission: RE | Admit: 2022-09-17 | Discharge: 2022-09-17 | Disposition: A | Discharge status: Home / Self Care | Payer: 59 | Source: Ambulatory Visit | Attending: Nurse Practitioner | Admitting: Nurse Practitioner

## 2022-09-17 DIAGNOSIS — Z1231 Encounter for screening mammogram for malignant neoplasm of breast: Secondary | ICD-10-CM

## 2022-09-18 ENCOUNTER — Other Ambulatory Visit: Payer: Self-pay | Admitting: Nurse Practitioner

## 2022-09-18 DIAGNOSIS — R928 Other abnormal and inconclusive findings on diagnostic imaging of breast: Secondary | ICD-10-CM

## 2022-10-03 ENCOUNTER — Ambulatory Visit
Admission: RE | Admit: 2022-10-03 | Discharge: 2022-10-03 | Disposition: A | Discharge status: Home / Self Care | Payer: 59 | Source: Ambulatory Visit | Attending: Nurse Practitioner | Admitting: Nurse Practitioner

## 2022-10-03 ENCOUNTER — Other Ambulatory Visit: Payer: Self-pay | Admitting: Nurse Practitioner

## 2022-10-03 DIAGNOSIS — R928 Other abnormal and inconclusive findings on diagnostic imaging of breast: Secondary | ICD-10-CM

## 2022-10-03 DIAGNOSIS — N6489 Other specified disorders of breast: Secondary | ICD-10-CM

## 2023-03-19 ENCOUNTER — Other Ambulatory Visit: Payer: 59

## 2023-05-17 ENCOUNTER — Ambulatory Visit: Admission: RE | Admit: 2023-05-17 | Payer: 59 | Source: Ambulatory Visit

## 2023-05-17 ENCOUNTER — Ambulatory Visit
Admission: RE | Admit: 2023-05-17 | Discharge: 2023-05-17 | Disposition: A | Discharge status: Home / Self Care | Payer: 59 | Source: Ambulatory Visit | Attending: Nurse Practitioner | Admitting: Nurse Practitioner

## 2023-05-17 DIAGNOSIS — N6489 Other specified disorders of breast: Secondary | ICD-10-CM

## 2023-05-19 ENCOUNTER — Other Ambulatory Visit: Payer: Self-pay | Admitting: Nurse Practitioner

## 2023-05-19 DIAGNOSIS — N6489 Other specified disorders of breast: Secondary | ICD-10-CM

## 2023-10-13 ENCOUNTER — Other Ambulatory Visit: Payer: Self-pay

## 2023-10-13 ENCOUNTER — Other Ambulatory Visit: Payer: Self-pay | Admitting: Physician Assistant

## 2023-10-13 DIAGNOSIS — N6489 Other specified disorders of breast: Secondary | ICD-10-CM

## 2023-11-18 ENCOUNTER — Ambulatory Visit
Admission: RE | Admit: 2023-11-18 | Discharge: 2023-11-18 | Disposition: A | Discharge status: Home / Self Care | Payer: 59 | Source: Ambulatory Visit | Attending: Physician Assistant | Admitting: Physician Assistant

## 2023-11-18 DIAGNOSIS — N6489 Other specified disorders of breast: Secondary | ICD-10-CM

## 2024-10-23 ENCOUNTER — Other Ambulatory Visit: Payer: Self-pay | Admitting: Physician Assistant

## 2024-10-23 DIAGNOSIS — N6489 Other specified disorders of breast: Secondary | ICD-10-CM

## 2024-11-20 ENCOUNTER — Ambulatory Visit
Admission: RE | Admit: 2024-11-20 | Discharge: 2024-11-20 | Disposition: A | Source: Ambulatory Visit | Attending: Physician Assistant

## 2024-11-20 DIAGNOSIS — N6489 Other specified disorders of breast: Secondary | ICD-10-CM
